# Patient Record
Sex: Male | Born: 1990 | Hispanic: Refuse to answer
Health system: Midwestern US, Community
[De-identification: ages and names within clinical notes are randomized; demographics above are authoritative.]

## PROBLEM LIST (undated history)

## (undated) DIAGNOSIS — B9689 Other specified bacterial agents as the cause of diseases classified elsewhere: Secondary | ICD-10-CM

## (undated) DIAGNOSIS — J019 Acute sinusitis, unspecified: Principal | ICD-10-CM

---

## 2015-04-07 ENCOUNTER — Encounter (HOSPITAL_BASED_OUTPATIENT_CLINIC_OR_DEPARTMENT_OTHER): Payer: Self-pay | Admitting: *Deleted

## 2015-04-07 ENCOUNTER — Emergency Department (HOSPITAL_BASED_OUTPATIENT_CLINIC_OR_DEPARTMENT_OTHER)
Admission: EM | Admit: 2015-04-07 | Discharge: 2015-04-08 | Disposition: A | Discharge status: Home / Self Care | Payer: BLUE CROSS/BLUE SHIELD | Attending: Emergency Medicine | Admitting: Emergency Medicine

## 2015-04-07 DIAGNOSIS — S0990XA Unspecified injury of head, initial encounter: Secondary | ICD-10-CM | POA: Diagnosis not present

## 2015-04-07 DIAGNOSIS — S0081XA Abrasion of other part of head, initial encounter: Secondary | ICD-10-CM | POA: Diagnosis not present

## 2015-04-07 DIAGNOSIS — Y998 Other external cause status: Secondary | ICD-10-CM | POA: Insufficient documentation

## 2015-04-07 DIAGNOSIS — Z720 Tobacco use: Secondary | ICD-10-CM | POA: Insufficient documentation

## 2015-04-07 DIAGNOSIS — S40212A Abrasion of left shoulder, initial encounter: Secondary | ICD-10-CM | POA: Diagnosis not present

## 2015-04-07 DIAGNOSIS — Y92410 Unspecified street and highway as the place of occurrence of the external cause: Secondary | ICD-10-CM | POA: Diagnosis not present

## 2015-04-07 DIAGNOSIS — Y9389 Activity, other specified: Secondary | ICD-10-CM | POA: Insufficient documentation
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
## 2015-04-08 ENCOUNTER — Emergency Department (HOSPITAL_BASED_OUTPATIENT_CLINIC_OR_DEPARTMENT_OTHER): Payer: BLUE CROSS/BLUE SHIELD

## 2015-04-08 NOTE — Discharge Instructions (Signed)
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
## 2021-07-12 ENCOUNTER — Ambulatory Visit: Admit: 2021-07-12 | Discharge: 2021-07-12 | Payer: BLUE CROSS/BLUE SHIELD | Attending: Family Medicine | Primary: Family

## 2021-07-12 DIAGNOSIS — T63441A Toxic effect of venom of bees, accidental (unintentional), initial encounter: Secondary | ICD-10-CM

## 2021-07-12 MED ORDER — PREDNISONE 20 MG PO TABS
20 MG | ORAL_TABLET | ORAL | 0 refills | Status: AC
Start: 2021-07-12 — End: 2022-01-02

## 2021-07-12 MED ORDER — TRIAMCINOLONE ACETONIDE 40 MG/ML IJ SUSP
40 MG/ML | Freq: Once | INTRAMUSCULAR | Status: AC
Start: 2021-07-12 — End: 2021-07-12
  Administered 2021-07-12: 16:00:00 40 mg via INTRAMUSCULAR

## 2021-07-12 NOTE — Progress Notes (Signed)
07/12/2021     Michael Quinn (DOB:  [birth year]) is a 30 y.o. male, here for evaluation of the following medical concerns:    HPI    Patient is seen today secondary to reaction to an insect bite.  Patient was mowing his yard yesterday and got stung in the left hand by a yellow jacket.  Overnight the hand has become more red swollen and itchy.  Digits of the hand have achiness primarily related to the swelling.  He has not had any systemic symptoms such as difficulty breathing palpitations difficulty swallowing or swelling in his throat or tongue.  Had been stung last year by yellowjacket the leg but did not have this much reaction.  Did take Benadryl but it did not really seem to provide him with much benefit.  Did use ice to the area last night but not noticing much benefit from using ice as well.    Did review patient's med list, allergies, social history,pmhx and pshx today as noted in the record.    Review of Systems   Constitutional:  Negative for chills, fatigue and fever.   HENT: Negative.     Eyes: Negative.    Respiratory: Negative.     Cardiovascular: Negative.    Musculoskeletal:  Positive for joint swelling. Negative for arthralgias and back pain.   Skin:  Positive for color change.     Prior to Visit Medications    Not on File        Social History     Tobacco Use    Smoking status: Never    Smokeless tobacco: Never   Substance Use Topics    Alcohol use: Not on file        Vitals:    07/12/21 1137 07/12/21 1138   BP: (!) 138/96 (!) 132/94  Comment: pt.in pain   Site: Right Upper Arm Right Upper Arm   Position: Sitting Sitting   Cuff Size: Medium Adult Medium Adult   Pulse: 74    Resp: 18    Temp: 97.3 ??F (36.3 ??C)    TempSrc: Tympanic    SpO2: 100%    Weight: 176 lb 6.4 oz (80 kg)    Height: 6\' 1"  (1.854 m)      Estimated body mass index is 23.27 kg/m?? as calculated from the following:    Height as of this encounter: 6\' 1"  (1.854 m).    Weight as of this encounter: 176 lb 6.4 oz (80 kg).    Physical  Exam  Vitals and nursing note reviewed.   Constitutional:       General: He is not in acute distress.     Appearance: He is well-developed. He is not diaphoretic.   HENT:      Head: Normocephalic and atraumatic.      Nose: No congestion or rhinorrhea.      Mouth/Throat:      Pharynx: No oropharyngeal exudate or posterior oropharyngeal erythema.      Comments: Oropharynx without evidence of swelling  Eyes:      Conjunctiva/sclera: Conjunctivae normal.   Cardiovascular:      Rate and Rhythm: Normal rate and regular rhythm.   Pulmonary:      Effort: Pulmonary effort is normal.      Breath sounds: Normal breath sounds. No wheezing, rhonchi or rales.   Musculoskeletal:         General: Swelling and tenderness present.      Cervical back: Normal range of motion.  Comments: Left hand with significant erythema and swelling noted to the entire hand up to about the lower third of the lower arm.  It is warm to the touch but not tender.  Able to move the digits of his hand but are somewhat limited secondary to the amount of swelling.   Skin:     General: Skin is warm and dry.      Coloration: Skin is not pale.      Findings: Erythema present. No rash.   Neurological:      Mental Status: He is alert and oriented to person, place, and time.   Psychiatric:         Behavior: Behavior normal.         Thought Content: Thought content normal.         Judgment: Judgment normal.       ASSESSMENT/PLAN:    Encounter Diagnosis   Name Primary?    Local reaction to bee sting, accidental or unintentional, initial encounter Yes     Orders Placed This Encounter   Medications    triamcinolone acetonide (KENALOG-40) injection 40 mg    predniSONE (DELTASONE) 20 MG tablet     Sig: 1 bid for 5 days     Dispense:  10 tablet     Refill:  0     No orders of the defined types were placed in this encounter.  Will give IM steroid today followed by oral steroid if needed.  If the IM steroid today seems to subside his symptoms he does not need to take  the oral steroid but did give him an additional 5 days of oral steroid to take until his swelling improves.    Would continue with use of oral antihistamines as needed for swelling or itching.    Return  if no improvement in symptoms or if any further symptoms arise.    (Please note that portions of this note were completed with a voice recognition program. Efforts were made to edit the dictations but occasionally words are mis-transcribed.)    No follow-ups on file.    An electronic signature was used to authenticate this note.    --Nolberto Hanlon, DO on 07/12/2021 at 12:02 PM

## 2021-07-22 NOTE — Telephone Encounter (Signed)
Confirmed appt with pt.  07/25/21 @ 3:20pm

## 2021-07-25 ENCOUNTER — Ambulatory Visit: Admit: 2021-07-25 | Discharge: 2021-07-25 | Payer: BLUE CROSS/BLUE SHIELD | Attending: Family | Primary: Family

## 2021-07-25 DIAGNOSIS — Z0000 Encounter for general adult medical examination without abnormal findings: Secondary | ICD-10-CM

## 2021-07-25 NOTE — Progress Notes (Signed)
Subjective:      Patient ID: Michael Quinn is a 30 y.o. male coming in for   Chief Complaint   Patient presents with    New Patient      Well Adult Physical   Patient here for a comprehensive physical exam.The patient reports no problems  Do you take any herbs or supplements that were not prescribed by a doctor? no Are you taking calcium supplements? no Are you taking aspirin daily? no  GU History:  N/a        Review of Systems   Constitutional:  Negative for fatigue and unexpected weight change.   Respiratory:  Negative for shortness of breath.    Cardiovascular:  Negative for chest pain and leg swelling.   Gastrointestinal:  Negative for abdominal pain, blood in stool, constipation and diarrhea.   Skin:  Negative for rash.   Neurological:  Negative for dizziness, light-headedness and headaches.        Objective:  BP 130/70    Pulse 70    Resp 16    Ht 6\' 1"  (1.854 m)    Wt 180 lb (81.6 kg)    SpO2 100%    BMI 23.75 kg/m??      Physical Exam  Vitals and nursing note reviewed.   Constitutional:       General: He is not in acute distress.     Appearance: Normal appearance. He is not ill-appearing.   HENT:      Head: Normocephalic.   Cardiovascular:      Rate and Rhythm: Normal rate and regular rhythm.      Heart sounds: Normal heart sounds.   Pulmonary:      Effort: Pulmonary effort is normal.      Breath sounds: Normal breath sounds.   Musculoskeletal:      Right lower leg: No edema.      Left lower leg: No edema.   Skin:     General: Skin is warm and dry.      Findings: No rash.   Neurological:      General: No focal deficit present.      Mental Status: He is alert and oriented to person, place, and time.        Assessment:      1. Encounter for wellness examination in adult           Plan:    -wellness visit, offered routine labs/screenings, denied at this time  -no other concerns addressed at today's visit  -f/u in one year.   No orders of the defined types were placed in this encounter.     Outpatient  Encounter Medications as of 07/25/2021   Medication Sig Dispense Refill    Multiple Vitamins-Minerals (THERAPEUTIC MULTIVITAMIN-MINERALS) tablet Take 1 tablet by mouth in the morning.       No facility-administered encounter medications on file as of 07/25/2021.            09/24/2021, APRN - CNP

## 2021-08-01 NOTE — Telephone Encounter (Signed)
From: Michael Quinn  To: Michael Quinn  Sent: 07/31/2021 12:31 PM EDT  Subject: Question on Chiropractic Care    I've gone to a Chiropractor occasionally for adjustment when I've had aches and pains with decent results, but is there any benefit to regular adjustments? Is getting adjusted as risky as some say?

## 2022-01-02 ENCOUNTER — Ambulatory Visit: Admit: 2022-01-02 | Discharge: 2022-01-02 | Payer: BLUE CROSS/BLUE SHIELD | Attending: Family | Primary: Family

## 2022-01-02 DIAGNOSIS — Z0000 Encounter for general adult medical examination without abnormal findings: Secondary | ICD-10-CM

## 2022-01-02 NOTE — Progress Notes (Signed)
Subjective:      Patient ID: Michael Quinn is a 31 y.o. male coming in for   Chief Complaint   Patient presents with    Follow-up     Pt wanting a work up, physical. Looking to start a family      Well Adult Physical   Patient here for a comprehensive physical exam.The patient reports problems - pt had an "anxiety" attack a few days after Thanksgiving while at work. He states he was reading and states he could read, but could not say aloud the words. He states he even took a few phone calls and states his speech was definitely effected. Episode lasted about 30-40 min. Other than speech and fuzzy brain fog, he had no other neurologic symptoms. He has never had this before. Denies any increased stress at work or home. He has not had another episode since. Pt and his wife are discussing possible having a child and he would like to have some routine labs done.     Do you take any herbs or supplements that were not prescribed by a doctor? no Are you taking calcium supplements? no Are you taking aspirin daily? no  GU History:  N/A    Review of Systems   Constitutional:  Negative for fatigue and unexpected weight change.   Respiratory:  Negative for shortness of breath.    Cardiovascular:  Negative for chest pain and leg swelling.   Gastrointestinal:  Negative for abdominal pain, blood in stool, constipation and diarrhea.   Skin:  Negative for rash.   Neurological:  Negative for dizziness, light-headedness and headaches.      Objective:BP 132/70    Pulse 53    Resp 16    Ht 6\' 1"  (1.854 m)    Wt 186 lb 9.6 oz (84.6 kg)    SpO2 99%    BMI 24.62 kg/m??      Physical Exam  Vitals and nursing note reviewed.   Constitutional:       General: He is not in acute distress.     Appearance: Normal appearance. He is not ill-appearing.   HENT:      Head: Normocephalic.   Eyes:      Extraocular Movements: Extraocular movements intact.      Pupils: Pupils are equal, round, and reactive to light.   Cardiovascular:      Rate and Rhythm:  Normal rate and regular rhythm.      Heart sounds: Normal heart sounds.   Pulmonary:      Effort: Pulmonary effort is normal.      Breath sounds: Normal breath sounds.   Musculoskeletal:      Right lower leg: No edema.      Left lower leg: No edema.   Skin:     General: Skin is warm and dry.      Findings: No rash.   Neurological:      General: No focal deficit present.      Mental Status: He is alert and oriented to person, place, and time.      Cranial Nerves: No cranial nerve deficit.      Motor: No weakness.      Gait: Gait normal.        Assessment:      1. Encounter for wellness examination in adult    2. Aphasia of unknown origin           Plan:    -routine labs ordered.   -discussed with  pt if the anxiety/aphasia symptoms occur again, please let me know and if they persist to go to ER    Orders Placed This Encounter   Procedures    Lipid Panel     Standing Status:   Future     Standing Expiration Date:   01/02/2023     Order Specific Question:   Is Patient Fasting?/# of Hours     Answer:   12    Hemoglobin A1C     Standing Status:   Future     Standing Expiration Date:   01/02/2023    Comprehensive Metabolic Panel     Standing Status:   Future     Standing Expiration Date:   01/02/2023    CBC with Auto Differential     Standing Status:   Future     Standing Expiration Date:   01/02/2023    TSH     Standing Status:   Future     Standing Expiration Date:   01/02/2023    T4, Free     Standing Status:   Future     Standing Expiration Date:   01/02/2023      Outpatient Encounter Medications as of 01/02/2022   Medication Sig Dispense Refill    Multiple Vitamins-Minerals (THERAPEUTIC MULTIVITAMIN-MINERALS) tablet Take 1 tablet by mouth in the morning.      [DISCONTINUED] predniSONE (DELTASONE) 20 MG tablet 1 bid for 5 days (Patient not taking: Reported on 01/02/2022) 10 tablet 0     No facility-administered encounter medications on file as of 01/02/2022.            Bryson Corona, APRN - CNP

## 2022-01-24 ENCOUNTER — Inpatient Hospital Stay: Payer: BLUE CROSS/BLUE SHIELD | Primary: Family

## 2022-01-24 DIAGNOSIS — Z0000 Encounter for general adult medical examination without abnormal findings: Secondary | ICD-10-CM

## 2022-01-24 LAB — CBC WITH AUTO DIFFERENTIAL
Absolute Eos #: 0.19 10*3/uL (ref 0.00–0.44)
Absolute Immature Granulocyte: <0.03 10*3/uL (ref 0.00–0.30)
Absolute Lymph #: 1.69 10*3/uL (ref 1.10–3.70)
Absolute Mono #: 0.53 10*3/uL (ref 0.10–1.20)
Basophils Absolute: 0.09 10*3/uL (ref 0.00–0.20)
Basophils: 2 % (ref 0–2)
Eosinophils %: 4 % (ref 1–4)
Hematocrit: 44.3 % (ref 40.7–50.3)
Hemoglobin: 15.2 g/dL (ref 13.0–17.0)
Immature Granulocytes: 0 %
Lymphocytes: 33 % (ref 24–43)
MCH: 30.1 pg (ref 25.2–33.5)
MCHC: 34.3 g/dL — ABNORMAL HIGH (ref 25.2–33.5)
MCV: 87.7 fL (ref 82.6–102.9)
MPV: 11.5 fL (ref 8.1–13.5)
Monocytes: 10 % (ref 3–12)
NRBC Automated: 0 per 100 WBC
Platelets: 270 10*3/uL (ref 138–453)
RBC: 5.05 m/uL (ref 4.21–5.77)
RDW: 12.7 % (ref 11.8–14.4)
Seg Neutrophils: 51 % (ref 36–65)
Segs Absolute: 2.61 10*3/uL (ref 1.50–8.10)
WBC: 5.1 10*3/uL (ref 3.5–11.3)

## 2022-01-24 LAB — COMPREHENSIVE METABOLIC PANEL
ALT: 12 U/L (ref 5–41)
AST: 18 U/L (ref <40)
Albumin/Globulin Ratio: 1.6 (ref 1.0–2.5)
Albumin: 4.8 g/dL (ref 3.5–5.2)
Alkaline Phosphatase: 51 U/L (ref 40–129)
Anion Gap: 11 mmol/L (ref 9–17)
BUN: 18 mg/dL (ref 6–20)
Bun/Cre Ratio: 16 (ref 9–20)
CO2: 26 mmol/L (ref 20–31)
Calcium: 9.5 mg/dL (ref 8.6–10.4)
Chloride: 104 mmol/L (ref 98–107)
Creatinine: 1.15 mg/dL (ref 0.70–1.20)
Est, Glom Filt Rate: >60 mL/min/{1.73_m2} (ref >60)
Glucose: 94 mg/dL (ref 70–99)
Potassium: 4.2 mmol/L (ref 3.7–5.3)
Sodium: 141 mmol/L (ref 135–144)
Total Bilirubin: 1.1 mg/dL (ref 0.3–1.2)
Total Protein: 7.8 g/dL (ref 6.4–8.3)

## 2022-01-24 LAB — LIPID PANEL
Chol/HDL Ratio: 2.3 (ref <5)
Cholesterol: 143 mg/dL (ref <200)
HDL: 61 mg/dL (ref >40)
LDL Cholesterol: 71 mg/dL (ref 0–130)
Triglycerides: 55 mg/dL (ref <150)

## 2022-01-24 LAB — TSH: TSH: 4.31 u[IU]/mL (ref 0.30–5.00)

## 2022-01-24 LAB — T4, FREE: Thyroxine, Free: 1.17 ng/dL (ref 0.93–1.70)

## 2022-01-25 LAB — HEMOGLOBIN A1C
Estimated Avg Glucose: 97 mg/dL
Hemoglobin A1C: 5 % (ref 4.0–6.0)

## 2022-01-27 NOTE — Telephone Encounter (Signed)
Pt returning nurses call regarding labs, informed of normal labs.

## 2022-07-26 ENCOUNTER — Encounter: Attending: Family | Primary: Family

## 2023-08-09 ENCOUNTER — Ambulatory Visit: Admit: 2023-08-09 | Discharge: 2023-08-09 | Payer: BLUE CROSS/BLUE SHIELD | Attending: Family | Primary: Family

## 2023-08-09 DIAGNOSIS — Z0000 Encounter for general adult medical examination without abnormal findings: Secondary | ICD-10-CM

## 2023-08-09 NOTE — Progress Notes (Signed)
Subjective:      Patient ID: Michael Quinn is a 32 y.o. male coming in for   Chief Complaint   Patient presents with    Annual Exam      Well Adult Physical   Patient here for a comprehensive physical exam.The patient reports no problems  Do you take any herbs or supplements that were not prescribed by a doctor? Daily multi vitamin. Are you taking calcium supplements? no Are you taking aspirin daily? no  GU History:  N/a      Review of Systems   Constitutional:  Negative for fatigue and unexpected weight change.   Respiratory:  Negative for shortness of breath.    Cardiovascular:  Negative for chest pain and leg swelling.   Gastrointestinal:  Negative for abdominal pain, blood in stool, constipation and diarrhea.   Skin:  Negative for rash.   Neurological:  Negative for dizziness, light-headedness and headaches.        Objective:BP 124/74   Pulse 71   Resp 16   Ht 1.854 m (6\' 1" )   Wt 80.7 kg (178 lb)   SpO2 100%   BMI 23.48 kg/m      Physical Exam  Vitals and nursing note reviewed.   Constitutional:       General: He is not in acute distress.     Appearance: Normal appearance. He is not ill-appearing.   HENT:      Head: Normocephalic.   Cardiovascular:      Rate and Rhythm: Normal rate and regular rhythm.      Heart sounds: Normal heart sounds.   Pulmonary:      Effort: Pulmonary effort is normal.      Breath sounds: Normal breath sounds.   Musculoskeletal:      Cervical back: Neck supple.      Right lower leg: No edema.      Left lower leg: No edema.   Skin:     General: Skin is warm and dry.      Findings: No rash.   Neurological:      General: No focal deficit present.      Mental Status: He is alert and oriented to person, place, and time.          Assessment:      1. Encounter for wellness examination in adult           Plan:    Offered routine labs, declined at this time  F/u in one year or sooner if needed.   No orders of the defined types were placed in this encounter.     Outpatient Encounter  Medications as of 08/09/2023   Medication Sig Dispense Refill    Multiple Vitamins-Minerals (THERAPEUTIC MULTIVITAMIN-MINERALS) tablet Take 1 tablet by mouth daily       No facility-administered encounter medications on file as of 08/09/2023.            Bryson Corona, APRN - CNP

## 2023-08-16 ENCOUNTER — Ambulatory Visit: Admit: 2023-08-16 | Discharge: 2023-08-16 | Payer: BLUE CROSS/BLUE SHIELD | Primary: Family

## 2023-08-16 DIAGNOSIS — T63441A Toxic effect of venom of bees, accidental (unintentional), initial encounter: Secondary | ICD-10-CM

## 2023-08-16 MED ORDER — PREDNISONE 20 MG PO TABS
20 | ORAL_TABLET | ORAL | 0 refills | Status: AC
Start: 2023-08-16 — End: ?

## 2023-08-16 MED ORDER — DEXAMETHASONE SODIUM PHOSPHATE 4 MG/ML IJ SOLN
4 | Freq: Once | INTRAMUSCULAR | Status: AC
Start: 2023-08-16 — End: 2023-08-16
  Administered 2023-08-16: 23:00:00 8 mg via INTRAMUSCULAR

## 2023-08-16 NOTE — Progress Notes (Signed)
[facility]  [address]  [place] [address]  Phone: [phone]  Fax: [phone]      Michael Quinn  01/18/1991  MRN: [id number]  Date of visit: 08/16/2023    Chief Complaint:     Michael Quinn is here for c/o of Insect Bite (Yellow jacket sting to left cheek 1/2 hour ago)      HPI:     Michael Quinn is a 32 y.o. male who presents to the [facility] today for his medical conditions/complaints as noted below.    Insect Bite  This is a new problem. The current episode started today. The problem occurs constantly. The problem has been rapidly worsening. Associated symptoms include a rash. Pertinent negatives include no neck pain, numbness or sore throat. He has tried nothing for the symptoms.   Stung by a yellow jacket. Patient states he is allergic and has very bad reactions in the past.     History reviewed. No pertinent past medical history.     Allergies   Allergen Reactions    Bee Venom Swelling and Rash    Seasonal Other (See Comments)     Watery eyes, itchy, cough- seasonal allergies         Subjective:      Review of Systems   HENT:  Negative for sore throat.    Musculoskeletal:  Negative for neck pain.   Skin:  Positive for rash.   Neurological:  Negative for numbness.       Objective:     Vitals:    08/16/23 1843   BP: 114/70   Site: Left Upper Arm   Position: Sitting   Cuff Size: Large Adult   Pulse: 52   Resp: 16   Temp: 97 F (36.1 C)   TempSrc: Tympanic   SpO2: 99%   Weight: 80.7 kg (178 lb)   Height: 1.854 m (6\' 1" )     Body mass index is 23.48 kg/m.    Physical Exam  Vitals and nursing note reviewed.   Constitutional:       Appearance: Normal appearance. He is not ill-appearing.   HENT:      Head: Normocephalic and atraumatic.      Nose: Nose normal.   Eyes:      Conjunctiva/sclera: Conjunctivae normal.   Cardiovascular:      Rate and Rhythm: Normal rate and regular rhythm.      Heart sounds: Normal heart  sounds. No murmur heard.     No friction rub. No gallop.   Pulmonary:      Effort: Pulmonary effort is normal.      Breath sounds: Normal breath sounds. No wheezing or rhonchi.   Skin:     General: Skin is warm and dry.      Findings: Erythema and rash present.             Comments: Erythema and swelling inferolaterally to the left eye, patient reports this is where sting was. No stinger present.   Neurological:      General: No focal deficit present.      Mental Status: He is alert.   Psychiatric:         Mood and Affect: Mood normal.         Behavior: Behavior normal.         Thought Content: Thought content normal.           Assessment  and Plan        Diagnosis Orders   1. Bee sting, accidental or unintentional, initial encounter  predniSONE (DELTASONE) 20 MG tablet    dexAMETHasone (DECADRON) injection 8 mg        Orders Placed This Encounter    predniSONE (DELTASONE) 20 MG tablet     Sig: Take 2 tabs by mouth for 5 days, then take 1 tab by mouth for 5 days     Dispense:  15 tablet     Refill:  0    dexAMETHasone (DECADRON) injection 8 mg     Steroid shot given today  Steroid taper x 10 days  Discussed taking medication as prescribed in AM with food  Avoid NSAIDs while taking prescription steroid  Take allergy medication daily, take benadryl at night  Use cold compress  If symptoms worsen, SOB, difficulty swallowing seek medical treatment  Patient verbalized understanding and agrees with plan of care     Discussed exam, plan of care, and follow-up at length with patient.  Reviewed all prescribed and recommended medications, administration and side effects. Encouraged patient to follow up with PCP or return to the clinic for no improvement and or worsening of symptoms. All questions were answered and they verbalized understanding and were agreeable with the plan.     Follow up as needed.        Electronically signed by Luciano Cutter, PA-C on 08/16/2023 at 7:16 PM

## 2023-08-16 NOTE — Patient Instructions (Signed)
Steroid shot given today  Steroid taper x 10 days  Discussed taking medication as prescribed in AM with food  Avoid NSAIDs while taking prescription steroid  Take allergy medication daily, take benadryl at night  Use cold compress  If symptoms worsen, SOB, difficulty swallowing seek medical treatment  Patient verbalized understanding and agrees with plan of care

## 2024-01-01 ENCOUNTER — Ambulatory Visit: Admit: 2024-01-01 | Payer: BLUE CROSS/BLUE SHIELD | Primary: Family

## 2024-01-01 VITALS — BP 112/80 | HR 68 | Temp 97.70000°F | Wt 190.4 lb

## 2024-01-01 DIAGNOSIS — J029 Acute pharyngitis, unspecified: Principal | ICD-10-CM

## 2024-01-01 MED ORDER — PREDNISONE 20 MG PO TABS
20 | ORAL_TABLET | Freq: Every day | ORAL | 0 refills | Status: AC
Start: 2024-01-01 — End: 2024-01-06

## 2024-01-01 NOTE — Progress Notes (Signed)
 [place]  [facility], [facility]  [facility] [place] [facility] - [facility]  [address]  [place] [address]  Dept: [phone]  Dept Fax: [phone]    Michael Quinn is a 33 y.o. male who presents today for his medical conditions/complaints as noted below. Michael Quinn is c/o of   Chief Complaint   Patient presents with    Pharyngitis     St and swelling for two weeks    .    HPI:     Patient presents due to pain in his throat that he has had for about 2 weeks. He also has a salty taste in his mouth that has also been present for about 2 weeks. He does have sinus issues around this time of year, but states these are different symptoms that he normally has. He is eating okay and drinking fluids. He does not have any pain with swallowing. He denies any nausea/vomiting, diarrhea, or fevers.    Pharyngitis  Associated symptoms include a sore throat. Pertinent negatives include no arthralgias, chest pain, chills, congestion, diaphoresis, fatigue, fever, nausea, swollen glands or vomiting.         History reviewed. No pertinent past medical history.  History reviewed. No pertinent surgical history.    History reviewed. No pertinent family history.    Social History     Tobacco Use    Smoking status: Never    Smokeless tobacco: Never   Substance Use Topics    Alcohol use: Yes      Prior to Visit Medications    Medication Sig Taking? Authorizing Provider   Multiple Vitamins-Minerals (THERAPEUTIC MULTIVITAMIN-MINERALS) tablet Take 1 tablet by mouth daily Yes [provider]   predniSONE  (DELTASONE ) 20 MG tablet Take 2 tabs by mouth for 5 days, then take 1 tab by mouth for 5 days  Patient not taking: Reported on 01/01/2024  Ahmad Herring, PA-C     Allergies   Allergen Reactions    Bee Venom Swelling and Rash    Seasonal Other (See Comments)     Watery eyes, itchy, cough- seasonal allergies       Subjective:      Review of Systems    Constitutional:  Negative for activity change, appetite change, chills, diaphoresis, fatigue and fever.   HENT:  Positive for postnasal drip and sore throat. Negative for congestion, dental problem, drooling, ear discharge, ear pain, facial swelling, mouth sores, rhinorrhea, trouble swallowing and voice change.    Eyes:  Negative for discharge.   Respiratory:  Negative for shortness of breath, wheezing and stridor.    Cardiovascular:  Negative for chest pain.   Gastrointestinal:  Negative for constipation, diarrhea, nausea and vomiting.   Musculoskeletal:  Negative for arthralgias.       Objective:     Physical Exam  Vitals reviewed.   Constitutional:       General: He is awake. He is not in acute distress.     Appearance: He is not ill-appearing, toxic-appearing or diaphoretic.   HENT:      Head: Normocephalic.      Right Ear: Tympanic membrane and external ear normal.      Left Ear: Tympanic membrane and external ear normal.      Nose: Nose normal. No congestion or rhinorrhea.      Mouth/Throat:      Mouth: Mucous membranes are moist.      Pharynx: Uvula midline. Posterior oropharyngeal erythema  and postnasal drip present. No pharyngeal swelling, oropharyngeal exudate or uvula swelling.      Tonsils: No tonsillar exudate or tonsillar abscesses. 1+ on the right. 1+ on the left.   Cardiovascular:      Rate and Rhythm: Normal rate and regular rhythm.      Heart sounds: Normal heart sounds.   Pulmonary:      Effort: Pulmonary effort is normal. No respiratory distress.      Breath sounds: Normal breath sounds. No stridor. No wheezing, rhonchi or rales.   Chest:      Chest wall: No tenderness.   Abdominal:      General: Bowel sounds are normal.   Lymphadenopathy:      Cervical: No cervical adenopathy.   Skin:     General: Skin is warm and dry.   Neurological:      Mental Status: He is alert and oriented to person, place, and time.   Psychiatric:         Mood and Affect: Mood normal.         Behavior: Behavior normal.  Behavior is cooperative.         Thought Content: Thought content normal.         Judgment: Judgment normal.       Vitals:    01/01/24 1630   BP: 112/80   Pulse: 68   Temp: 97.7 F (36.5 C)   SpO2: 98%   Weight: 86.4 kg (190 lb 6.4 oz)         Assessment:       Diagnosis Orders   1. Pharyngitis, unspecified etiology              Plan:   Assessment & Plan   Return if symptoms worsen or fail to improve.       Orders Placed This Encounter   Medications    predniSONE  (DELTASONE ) 20 MG tablet     Sig: Take 1 tablet by mouth daily for 5 days     Dispense:  5 tablet     Refill:  0        Will have patient try a short course of prednisone  for his throat irritation/inflammation.   Take steroid in AM with food  Continue using mouthwash and good dental hygiene  Can use Ibuprofen/Tylenol as needed for pain  Return to walk in clinic or follow up with PCP if symptoms worsen or do not improve after course of steroids    Patient given educational materials - see patient instructions. Discussed use, benefits, and side effects of prescribed medications with patient. All patient questions answered and patient verbalized understanding. Instructed to continue current medications, diet and exercise. Patient agreed with the treatment plan. Encouraged patient to follow up with PCP or return to the clinic for no improvement and or worsening of symptoms.    Electronically signed by Ileana Stanley, APRN - CNP on 01/01/2024 at 5:13 PM

## 2024-01-01 NOTE — Patient Instructions (Signed)
 Take steroid in AM with food  Continue using mouthwash and good dental hygiene  Return to walk in clinic or follow up with PCP if symptoms worsen or do not improve after course of steroids

## 2024-10-10 MED ORDER — AMOXICILLIN-POT CLAVULANATE 875-125 MG PO TABS
875-125 | ORAL_TABLET | Freq: Two times a day (BID) | ORAL | 0 refills | 10.00000 days | Status: AC
Start: 2024-10-10 — End: 2024-10-17

## 2024-10-10 MED ORDER — PREDNISONE 10 MG PO TABS
10 | ORAL_TABLET | Freq: Two times a day (BID) | ORAL | 0 refills | 5.00000 days | Status: AC
Start: 2024-10-10 — End: 2024-10-15

## 2024-10-10 NOTE — Progress Notes (Signed)
"  Plan to treat for sinusitis with augmentin  and prednisone  as prescribed. 5 minutes spent reviewing questionnaire and establishing plan of care.   "
